# Patient Record
Sex: Male | Born: 1970 | Race: White | Hispanic: No | Marital: Married | Smoking: Never smoker
Health system: Southern US, Community
[De-identification: ages and names within clinical notes are randomized; demographics above are authoritative.]

---

## 2017-04-10 ENCOUNTER — Emergency Department
Admission: EM | Admit: 2017-04-10 | Discharge: 2017-04-10 | Disposition: A | Discharge status: Home / Self Care | Payer: Self-pay | Attending: Emergency Medicine | Admitting: Emergency Medicine

## 2017-04-10 ENCOUNTER — Other Ambulatory Visit: Payer: Self-pay

## 2017-04-10 DIAGNOSIS — Z4802 Encounter for removal of sutures: Secondary | ICD-10-CM | POA: Insufficient documentation

## 2017-04-10 NOTE — ED Triage Notes (Signed)
Pt states 4 staples R front head and 3 staples L front head that need to be removed. Been there 6 days. Alert, oriented, ambulatory. No distress noted.

## 2017-04-10 NOTE — ED Notes (Signed)
Here to have 7 staples removed. No problems per pt.

## 2017-04-10 NOTE — ED Provider Notes (Addendum)
   [facility] Emergency Department Provider Note  ____________________________________________   First MD Initiated Contact with Patient 04/10/17 1246     (approximate)  I have reviewed the triage vital signs and the nursing notes.   HISTORY  Chief Complaint Suture / Staple Removal   HPI Colton Neal is a 47 y.o. male is here for staple removal.  Patient states he has 7 staples total.  He is not had any difficulty with these.  No drainage or fever has been noted.   History reviewed. No pertinent past medical history.  There are no active problems to display for this patient.   History reviewed. No pertinent surgical history.  Prior to Admission medications   Not on File    Allergies Patient has no known allergies.  History reviewed. No pertinent family history.  Social History Social History   Tobacco Use  . Smoking status: Never Smoker  Substance Use Topics  . Alcohol use: No    Frequency: Never  . Drug use: Not on file    Review of Systems Constitutional: No fever/chills Eyes: No visual changes. Cardiovascular: Denies chest pain. Respiratory: Denies shortness of breath. Skin: Negative for infection at staple site. Neurological: Negative for headaches, focal weakness or numbness. ___________________________________________   PHYSICAL EXAM:  VITAL SIGNS: ED Triage Vitals [04/10/17 1237]  Enc Vitals Group     BP 128/82     Pulse Rate 63     Resp 16     Temp 97.8 F (36.6 C)     Temp Source Oral     SpO2 100 %     Weight 195 lb (88.5 kg)     Height 6\' 1"  (1.854 m)     Head Circumference      Peak Flow      Pain Score      Pain Loc      Pain Edu?      Excl. in GC?    Constitutional: Alert and oriented. Well appearing and in no acute distress. Eyes: Conjunctivae are normal.  Head: Atraumatic. Neck: No stridor.   Respiratory: Normal respiratory effort.  No retractions.  Musculoskeletal: Moves upper and lower  extremities without any difficulty.  Normal gait was noted. Neurologic:  Normal speech and language. No gross focal neurologic deficits are appreciated.  Skin:  Skin is warm, dry.  Staple areas have healed without any signs of infection. Psychiatric: Mood and affect are normal. Speech and behavior are normal.  ____________________________________________   LABS (all labs ordered are listed, but only abnormal results are displayed)  Labs Reviewed - No data to display  PROCEDURES  Procedure(s) performed: None  Procedures  Critical Care performed: No  ____________________________________________   INITIAL IMPRESSION / ASSESSMENT AND PLAN / ED COURSE  Staples were removed and patient was discharged without any difficulties.  ____________________________________________   FINAL CLINICAL IMPRESSION(S) / ED DIAGNOSES  Final diagnoses:  Encounter for staple removal     ED Discharge Orders    None       Note:  This document was prepared using Dragon voice recognition software and may include unintentional dictation errors.    Tommi RumpsSummers, Tiwana Chavis L, PA-C 04/10/17 1401    Tommi RumpsSummers, Cathey Fredenburg L, PA-C 04/18/17 1536    Emily FilbertWilliams, Jonathan E, MD 04/18/17 1537

## 2017-04-10 NOTE — ED Notes (Signed)
7 staples total removed from scalp. No complications

## 2017-04-10 NOTE — Discharge Instructions (Signed)
[facility] if any continued problems.  Continue keeping the area clean and dry.  You may wash your hair but allow hair and scalp to dry.

## 2017-04-18 NOTE — ED Provider Notes (Signed)
Medical screening examination/treatment/procedure(s) were performed by non-physician practitioner and as supervising physician I was immediately available for consultation/collaboration.      Emily FilbertWilliams, Murial Beam E, MD 04/18/17 [phone]

## 2018-12-01 ENCOUNTER — Emergency Department
Admission: EM | Admit: 2018-12-01 | Discharge: 2018-12-01 | Disposition: A | Discharge status: Home / Self Care | Payer: Self-pay | Attending: Student | Admitting: Student

## 2018-12-01 ENCOUNTER — Other Ambulatory Visit: Payer: Self-pay

## 2018-12-01 ENCOUNTER — Encounter: Payer: Self-pay | Admitting: Intensive Care

## 2018-12-01 DIAGNOSIS — R21 Rash and other nonspecific skin eruption: Secondary | ICD-10-CM | POA: Insufficient documentation

## 2018-12-01 DIAGNOSIS — L237 Allergic contact dermatitis due to plants, except food: Secondary | ICD-10-CM | POA: Insufficient documentation

## 2018-12-01 DIAGNOSIS — F17228 Nicotine dependence, chewing tobacco, with other nicotine-induced disorders: Secondary | ICD-10-CM | POA: Insufficient documentation

## 2018-12-01 MED ORDER — PREDNISONE 10 MG (21) PO TBPK: ORAL_TABLET | ORAL | 0 refills | Status: DC

## 2018-12-01 NOTE — ED Provider Notes (Signed)
[facility] Emergency Department Provider Note  ____________________________________________  Time seen: Approximately 6:04 PM  I have reviewed the triage vital signs and the nursing notes.   HISTORY  Chief Complaint Poison Oak   HPI Colton Neal is a 48 y.o. male presents to the emergency department for treatment and evaluation of swelling and erythema around both eyes.  He does believe that it is poison oak and states that he also has a rash on both arms. He pulled weeds and vines away from a well last Friday. Symptoms started on Saturday and have progressively worsened. No relief with Benadryl.  History reviewed. No pertinent past medical history.  There are no active problems to display for this patient.   History reviewed. No pertinent surgical history.  Prior to Admission medications   Medication Sig Start Date End Date Taking? Authorizing Provider  predniSONE (STERAPRED UNI-PAK 21 TAB) 10 MG (21) TBPK tablet Take 6 tablets on the first day and decrease by 1 tablet each day until finished. 12/01/18   Chinita Pester, FNP    Allergies Patient has no known allergies.  History reviewed. No pertinent family history.  Social History Social History   Tobacco Use  . Smoking status: Never Smoker  . Smokeless tobacco: Current User    Types: Snuff  Substance Use Topics  . Alcohol use: No    Frequency: Never  . Drug use: Never    Review of Systems  Constitutional: Negative for fever. Respiratory: Negative for cough or shortness of breath.  Musculoskeletal: Negative for myalgias Skin: Positive for erythema and swelling around eyes and rash of forearms. Neurological: Negative for numbness or paresthesias. ____________________________________________   PHYSICAL EXAM:  VITAL SIGNS: ED Triage Vitals  Enc Vitals Group     BP 12/01/18 1753 131/83     Pulse Rate 12/01/18 1753 63     Resp 12/01/18 1753 16     Temp 12/01/18 1753 98.6 F (37  C)     Temp Source 12/01/18 1753 Oral     SpO2 12/01/18 1753 100 %     Weight 12/01/18 1754 180 lb (81.6 kg)     Height 12/01/18 1754 6' 0.5" (1.842 m)     Head Circumference --      Peak Flow --      Pain Score 12/01/18 1754 0     Pain Loc --      Pain Edu? --      Excl. in GC? --      Constitutional: Well appearing. Eyes: Conjunctivae are clear without discharge or drainage. Nose: No rhinorrhea noted. Mouth/Throat: Airway is patent.  Neck: No stridor. Unrestricted range of motion observed. Cardiovascular: Capillary refill is <3 seconds.  Respiratory: Respirations are even and unlabored.. Musculoskeletal: Unrestricted range of motion observed. Neurologic: Awake, alert, and oriented x 4.  Skin:  Bilateral eyelid erythema with mid edema. Vesicular rash to bilateral forearms.  ____________________________________________   LABS (all labs ordered are listed, but only abnormal results are displayed)  Labs Reviewed - No data to display ____________________________________________  EKG  Not indicated. ____________________________________________  RADIOLOGY  Not indicated. ____________________________________________   PROCEDURES  Procedures ____________________________________________   INITIAL IMPRESSION / ASSESSMENT AND PLAN / ED COURSE  Colton Neal is a 48 y.o. male who presents to the emergency department for treatment and evaluation of facial swelling with erythema and rash to bilateral forearms.  History and exam are consistent with contact dermatitis secondary to poison ivy or poison sumac.  Patient will  be treated with a taper dose of prednisone and advised to continue taking Benadryl as prescribed.  He is to follow-up with his primary care provider or return to the emergency department for symptoms of change or worsen.   Medications - No data to display   Pertinent labs & imaging results that were available during my care of the patient were reviewed by  me and considered in my medical decision making (see chart for details).  ____________________________________________   FINAL CLINICAL IMPRESSION(S) / ED DIAGNOSES  Final diagnoses:  Contact dermatitis due to poison ivy    ED Discharge Orders         Ordered    predniSONE (STERAPRED UNI-PAK 21 TAB) 10 MG (21) TBPK tablet     12/01/18 1812           Note:  This document was prepared using Dragon voice recognition software and may include unintentional dictation errors.   Victorino Dike, FNP 12/01/18 1819    Lilia Pro., MD 12/02/18 [phone]

## 2018-12-01 NOTE — ED Triage Notes (Signed)
Patients has bilaterally puffy eyes with redness around it. He believes it is poison oak and reports now has rash on both arms

## 2018-12-01 NOTE — ED Notes (Signed)
See triage note  States he was changing out a well pump and came in contact with poison oak

## 2019-11-30 ENCOUNTER — Emergency Department
Admission: EM | Admit: 2019-11-30 | Discharge: 2019-11-30 | Disposition: A | Discharge status: Home / Self Care | Payer: HRSA Program | Attending: Emergency Medicine | Admitting: Emergency Medicine

## 2019-11-30 ENCOUNTER — Encounter: Payer: Self-pay | Admitting: Emergency Medicine

## 2019-11-30 ENCOUNTER — Emergency Department: Payer: HRSA Program

## 2019-11-30 ENCOUNTER — Other Ambulatory Visit: Payer: Self-pay

## 2019-11-30 DIAGNOSIS — U071 COVID-19: Secondary | ICD-10-CM | POA: Insufficient documentation

## 2019-11-30 DIAGNOSIS — J168 Pneumonia due to other specified infectious organisms: Secondary | ICD-10-CM | POA: Insufficient documentation

## 2019-11-30 DIAGNOSIS — J189 Pneumonia, unspecified organism: Secondary | ICD-10-CM

## 2019-11-30 DIAGNOSIS — R059 Cough, unspecified: Secondary | ICD-10-CM | POA: Diagnosis present

## 2019-11-30 LAB — RESPIRATORY PANEL BY RT PCR (FLU A&B, COVID)
Influenza A by PCR: NEGATIVE
Influenza B by PCR: NEGATIVE
SARS Coronavirus 2 by RT PCR: POSITIVE — AB

## 2019-11-30 MED ORDER — AMOXICILLIN 500 MG PO CAPS: 1000.0000 mg | ORAL_CAPSULE | Freq: Three times a day (TID) | ORAL | 0 refills | Status: AC

## 2019-11-30 NOTE — ED Triage Notes (Signed)
Pt reports productive cough and some congestion for the past week. Denies SOB or CP

## 2019-11-30 NOTE — Discharge Instructions (Signed)
Begin taking antibiotics as directed.  You may also want to get a probiotic to take with the antibiotic to prevent stomach upset. Today your Covid test is positive.  Return to the emergency department immediately if any shortness of breath or difficulty breathing.  You may take Tylenol or ibuprofen if needed for body aches, fever, headache or sore throat.  Let anyone that you have come in contact with know that you are positive for Covid.  Quarantine at home.  And it also was written for work for you to remain out.

## 2019-11-30 NOTE — ED Provider Notes (Signed)
[facility] Emergency Department Provider Note   ____________________________________________   First MD Initiated Contact with Patient 11/30/19 1229     (approximate)  I have reviewed the triage vital signs and the nursing notes.   HISTORY  Chief Complaint Cough    HPI Colton Neal is a 49 y.o. male presents to the ED with complaint of congestion and productive cough for the past week.  Patient denies any shortness of breath or chest pain.  Patient is a non-smoker.  He is felt feverish in the evenings only.  Patient denies any change in taste or smell but has experienced some diarrhea.  Patient is nonvaccinated.  He is unaware of any known Covid exposure.       History reviewed. No pertinent past medical history.  There are no problems to display for this patient.   History reviewed. No pertinent surgical history.  Prior to Admission medications   Medication Sig Start Date End Date Taking? Authorizing Provider  amoxicillin (AMOXIL) 500 MG capsule Take 2 capsules (1,000 mg total) by mouth 3 (three) times daily. 11/30/19   Tommi Rumps, PA-C    Allergies Patient has no known allergies.  No family history on file.  Social History Social History   Tobacco Use  . Smoking status: Never Smoker  . Smokeless tobacco: Current User    Types: Snuff  Substance Use Topics  . Alcohol use: No  . Drug use: Never    Review of Systems Constitutional: Subjective fever/chills Eyes: No visual changes. ENT: No sore throat. Cardiovascular: Denies chest pain. Respiratory: Denies shortness of breath.  Positive productive cough. Gastrointestinal: No abdominal pain.  No nausea, no vomiting.  Positive diarrhea.  No constipation. Genitourinary: Negative for dysuria. Musculoskeletal: Negative for back pain. Skin: Negative for rash. Neurological: Negative for headaches, focal weakness or  numbness. ____________________________________________   PHYSICAL EXAM:  VITAL SIGNS: ED Triage Vitals  Enc Vitals Group     BP 11/30/19 1104 (!) 133/57     Pulse Rate 11/30/19 1101 92     Resp 11/30/19 1101 20     Temp 11/30/19 1101 99.5 F (37.5 C)     Temp Source 11/30/19 1101 Oral     SpO2 11/30/19 1101 97 %     Weight 11/30/19 1104 170 lb (77.1 kg)     Height 11/30/19 1104 6' (1.829 m)     Head Circumference --      Peak Flow --      Pain Score 11/30/19 1104 0     Pain Loc --      Pain Edu? --      Excl. in GC? --     Constitutional: Alert and oriented. Well appearing and in no acute distress. Eyes: Conjunctivae are normal. PERRL. EOMI. Head: Atraumatic. Nose: No congestion/rhinnorhea. Mouth/Throat: Mucous membranes are moist.  Oropharynx non-erythematous. Neck: No stridor.   Cardiovascular: Normal rate, regular rhythm. Grossly normal heart sounds.  Good peripheral circulation. Respiratory: Normal respiratory effort.  No retractions. Lungs bilateral expiratory wheezes are heard throughout.  Patient is able to talk in complete sentences without any difficulty breathing. Gastrointestinal: Soft and nontender. No distention. No abdominal bruits. No CVA tenderness. Musculoskeletal: No lower extremity tenderness nor edema.  No joint effusions. Neurologic:  Normal speech and language. No gross focal neurologic deficits are appreciated. No gait instability. Skin:  Skin is warm, dry and intact. No rash noted. Psychiatric: Mood and affect are normal. Speech and behavior are normal.  ____________________________________________  LABS (all labs ordered are listed, but only abnormal results are displayed)  Labs Reviewed  RESPIRATORY PANEL BY RT PCR (FLU A&B, COVID) - Abnormal; Notable for the following components:      Result Value   SARS Coronavirus 2 by RT PCR POSITIVE (*)    All other components within normal limits     RADIOLOGY I, Tommi Rumps, personally  viewed and evaluated these images (plain radiographs) as part of my medical decision making, as well as reviewing the written report by the radiologist.   Official radiology report(s): DG Chest 2 View  Result Date: 11/30/2019 CLINICAL DATA:  Productive cough for the past week. EXAM: CHEST - 2 VIEW COMPARISON:  None. FINDINGS: The heart size and mediastinal contours are within normal limits. Normal pulmonary vascularity. Increased patchy density in the right middle and lower lobes, best appreciated on the lateral view. No pleural effusion or pneumothorax. No acute osseous abnormality. IMPRESSION: 1. Right middle and lower lobe pneumonia. Electronically Signed   By: Obie Dredge M.D.   On: 11/30/2019 11:26    ____________________________________________   PROCEDURES  Procedure(s) performed (including Critical Care):  Procedures   ____________________________________________   INITIAL IMPRESSION / ASSESSMENT AND PLAN / ED COURSE  As part of my medical decision making, I reviewed the following data within the electronic MEDICAL RECORD NUMBER Notes from prior ED visits and [place] Controlled Substance Database  50 year old male presents to the ED with complaint of productive cough and congestion that started last week.  Patient believes that he initially began feeling "a little bad last Monday".  Patient is a non-smoker, nonvaccinated and without any respiratory illnesses.  He denies any shortness of breath or chest pain.  Chest x-ray showed both right middle and lower lobe pneumonia.  I discussed this with Dr. Larinda Buttery who is present and flex.  It was decided that we would cover this patient with antibiotics due to the nature of his pneumonia.  A prescription for amoxicillin 1 g 3 times daily was sent to his pharmacy.  He is aware that he needs to return to the emergency department if any shortness of breath or difficulty breathing.  He also was given a note to remain out of work due to [employer].  He also  agrees to quarantine during this time as well.  ____________________________________________   FINAL CLINICAL IMPRESSION(S) / ED DIAGNOSES  Final diagnoses:  Pneumonia of right middle lobe due to infectious organism  Pneumonia of right lower lobe due to infectious organism  COVID-19 virus detected     ED Discharge Orders         Ordered    amoxicillin (AMOXIL) 500 MG capsule  3 times daily        11/30/19 1448          *Please note:  Mearle Drew was evaluated in Emergency Department on 11/30/2019 for the symptoms described in the history of present illness. He was evaluated in the context of the global COVID-19 pandemic, which necessitated consideration that the patient might be at risk for infection with the SARS-CoV-2 virus that causes COVID-19. Institutional protocols and algorithms that pertain to the evaluation of patients at risk for COVID-19 are in a state of rapid change based on information released by regulatory bodies including the CDC and federal and state organizations. These policies and algorithms were followed during the patient's care in the ED.  Some ED evaluations and interventions may be delayed as a result of limited staffing during and  the pandemic.*   Note:  This document was prepared using Dragon voice recognition software and may include unintentional dictation errors.    Tommi Rumps, PA-C 11/30/19 1521    Chesley Noon, MD 11/30/19 1730

## 2019-12-01 ENCOUNTER — Telehealth: Payer: Self-pay | Admitting: Nurse Practitioner

## 2019-12-01 NOTE — Telephone Encounter (Signed)
Called patient to discuss Covid symptoms and the use of casirivimab/imdevimab, a monoclonal antibody infusion for those with mild to moderate Covid symptoms and at a high risk of hospitalization.  Pt is qualified for this infusion at the [facility] due to; Specific high risk criteria : Other high risk medical condition per CDC:  high risk group   Message left to call back our hotline [phone].  Nicolasa Ducking, NP

## 2021-05-29 IMAGING — CR DG CHEST 2V
1 series · 2 of 2 positions shown · non-contrast
Comparison: None.

CLINICAL DATA: Productive cough for the past week.

EXAM:
CHEST - 2 VIEW

[Series 1: w chest pa · 0.14mm/px · 2 of 2 slices shown]
[im 1/2]
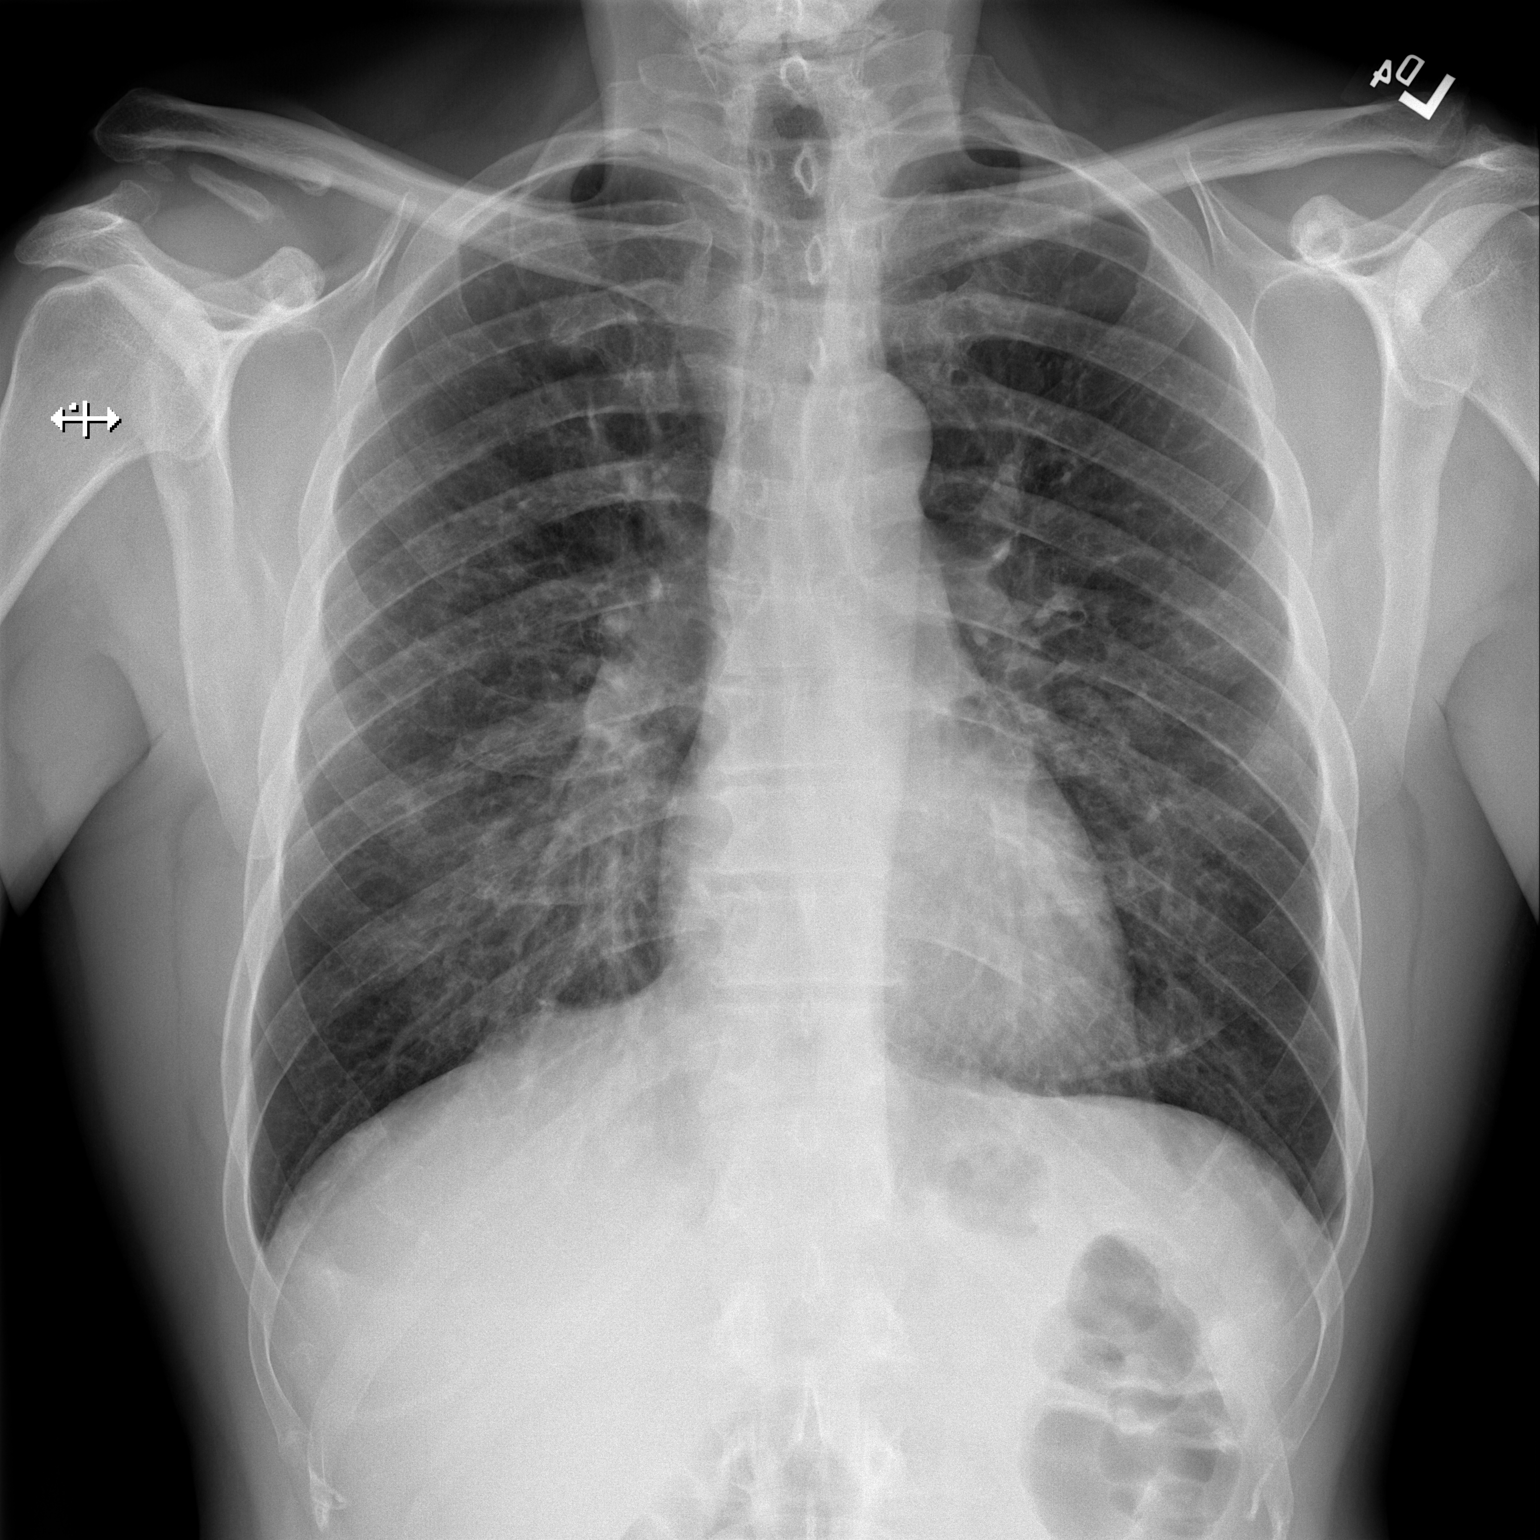
[im 2/2]
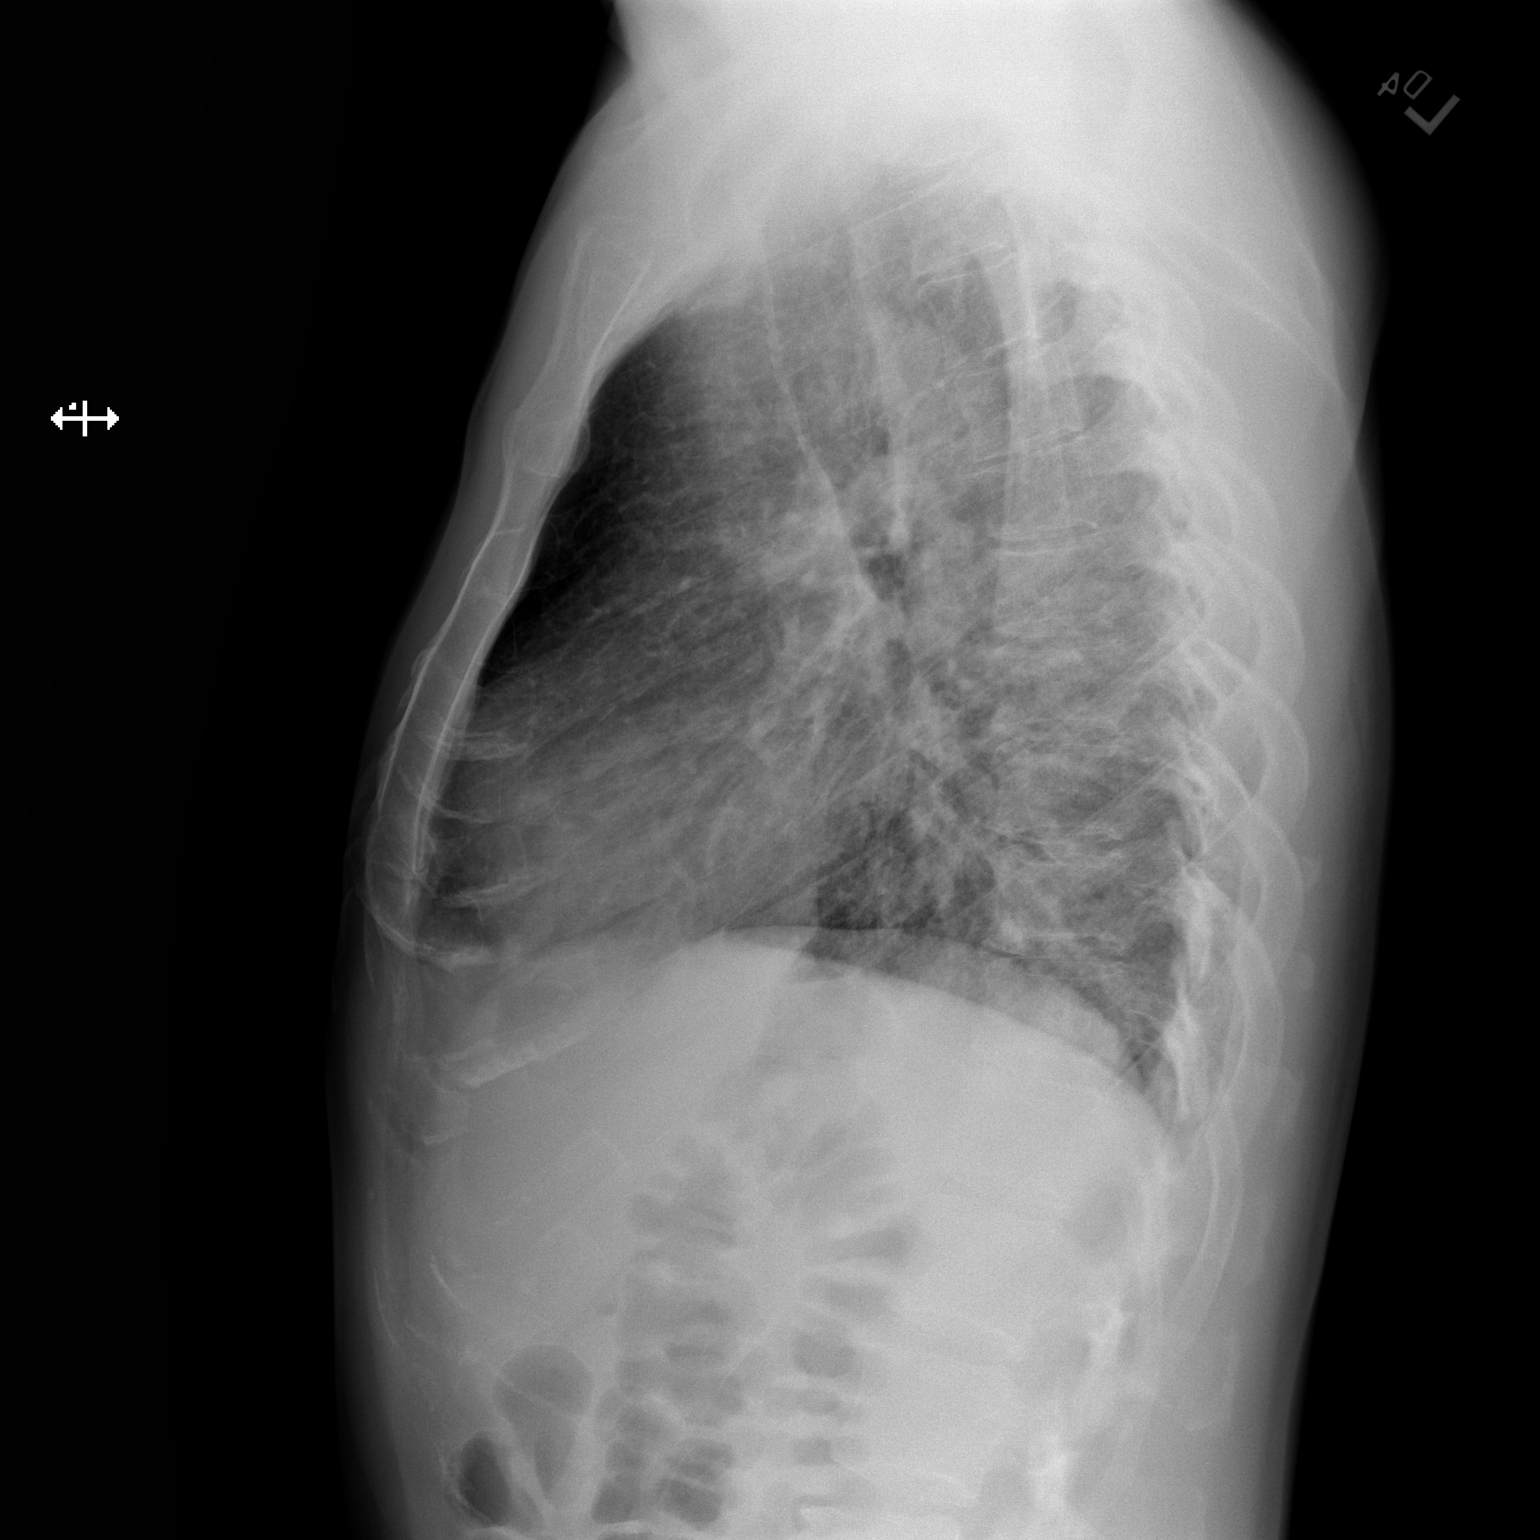

[2 of 2 positions shown; findings below may reference images not displayed]

FINDINGS: The heart size and mediastinal contours are within normal limits.
Normal pulmonary vascularity. Increased patchy density in the right
middle and lower lobes, best appreciated on the lateral view. No
pleural effusion or pneumothorax. No acute osseous abnormality.
IMPRESSION: 1. Right middle and lower lobe pneumonia.
